# Patient Record
Sex: Male | Born: 1980 | Race: White | Hispanic: No | Marital: Single | Smoking: Former smoker
Health system: Southern US, Community
[De-identification: ages and names within clinical notes are randomized; demographics above are authoritative.]

## PROBLEM LIST (undated history)

## (undated) DIAGNOSIS — K589 Irritable bowel syndrome without diarrhea: Secondary | ICD-10-CM

## (undated) DIAGNOSIS — F32A Depression, unspecified: Secondary | ICD-10-CM

## (undated) DIAGNOSIS — F329 Major depressive disorder, single episode, unspecified: Secondary | ICD-10-CM

## (undated) DIAGNOSIS — F17200 Nicotine dependence, unspecified, uncomplicated: Secondary | ICD-10-CM

## (undated) DIAGNOSIS — G4700 Insomnia, unspecified: Secondary | ICD-10-CM

## (undated) HISTORY — DX: Irritable bowel syndrome, unspecified: K58.9

## (undated) HISTORY — DX: Insomnia, unspecified: G47.00

## (undated) HISTORY — DX: Major depressive disorder, single episode, unspecified: F32.9

## (undated) HISTORY — DX: Nicotine dependence, unspecified, uncomplicated: F17.200

## (undated) HISTORY — DX: Depression, unspecified: F32.A

## (undated) HISTORY — DX: Irritable bowel syndrome without diarrhea: K58.9

---

## 2011-07-16 ENCOUNTER — Ambulatory Visit (INDEPENDENT_AMBULATORY_CARE_PROVIDER_SITE_OTHER): Payer: BC Managed Care – PPO | Admitting: Family Medicine

## 2011-07-16 ENCOUNTER — Encounter: Payer: Self-pay | Admitting: Family Medicine

## 2011-07-16 VITALS — BP 120/70 | HR 84 | Ht 70.0 in | Wt 160.0 lb

## 2011-07-16 DIAGNOSIS — G4700 Insomnia, unspecified: Secondary | ICD-10-CM

## 2011-07-16 DIAGNOSIS — F3289 Other specified depressive episodes: Secondary | ICD-10-CM

## 2011-07-16 DIAGNOSIS — F17200 Nicotine dependence, unspecified, uncomplicated: Secondary | ICD-10-CM

## 2011-07-16 DIAGNOSIS — F329 Major depressive disorder, single episode, unspecified: Secondary | ICD-10-CM | POA: Insufficient documentation

## 2011-07-16 MED ORDER — CLONAZEPAM 1 MG PO TABS: 0.5000 mg | ORAL_TABLET | Freq: Every evening | ORAL | Status: DC | PRN

## 2011-07-16 MED ORDER — ESCITALOPRAM OXALATE 20 MG PO TABS: 20.0000 mg | ORAL_TABLET | Freq: Every day | ORAL | Status: DC

## 2011-07-16 NOTE — Patient Instructions (Signed)
Lexapro 20mg .  Start at 1/4 tablet for a few days, then increase to 1/2 tablet for 1-2 weeks, then up to full tablet daily.   Clonazepam for sleep--do not expect this to be used longterm.  Rash on toe--if you have used antifungal cream twice daily for over 2-3 weeks without improvement, and without any spread, likely not fungal.  Try a hydrocortisone cream twice daily for a week.

## 2011-07-16 NOTE — Progress Notes (Signed)
Chief Complaint  Patient presents with  . Headache    been having really bad headaches and trouble sleeping x 1 month. States that he was previously on Lexapro 20 mg would like to possibly restart this medication.   HPI:  He was on Lexapro for 3 years, stopped it about 2 years ago.  Things seemed to be okay until the last 6 months.  He is staying up late worrying about things, trouble falling asleep, feeling down.  Sometimes cries.  Loss of interest in things--no longer likes to play video games, football, or golf.  Trouble falling asleep, not staying asleep.  History of panic attacks, none recent. Lost weight--down 30 pounds in the last 2 years, unintentional.  Has had decreased appetite.  H/o depression since the age of 31 or 66.  Has been on Celexa, Wellbutrin, fluoxetine and Lexapro.  Lexapro seemed to work the best.  Nurse, learning disability and psychologist in the past, seemed to help at the time (about 10 years ago).  Not interested in counseling at this time mainly due to finances, so would like to try to restart meds first.  If he can afford it, and needs it, he would reconsider counseling.  He was off meds age [age], seems to be having recurrent depression every 6 years or so.  Fluoxetine caused terrible heartburn.  Celexa as okay, but didn't work as well as the [employer].  Can't recall what happened with the wellbutrin (was 10 years ago).   Insomnia--previously took seroquel, klonopin, trazadone, remeron and [place].  Didn't like Ambien at all, made him feel too groggy in the mornings.  Seroquel caused too much hunger. Klonopin seemed to work the best for him in the past.  Also complaining of possible athletes foot--using tinactin. Just on left foot, between 3rd and 4th toes. He has had this for quite a while, and doesn't seem to be responding to the topical antifungal.  It is very pruritic.  Past Medical History  Diagnosis Date  . Depression age 31    no SI or hospitalizations  . Smoker   .  Insomnia   . IBS (irritable bowel syndrome)     History reviewed. No pertinent past surgical history.  History   Social History  . Marital Status: Single    Spouse Name: N/A    Number of Children: 0  . Years of Education: N/A   Occupational History  . carpenter    Social History Main Topics  . Smoking status: Current Everyday Smoker -- 1.0 packs/day for 15 years  . Smokeless tobacco: Never Used  . Alcohol Use: Yes     very rare, once yearly  . Drug Use: Yes     marijuana usage maybe twice a year.  . Sexually Active: Not on file   Other Topics Concern  . Not on file   Social History Narrative   Kyle Castaneda; in between jobs currently.  Lives with a roommate, 1 cat    Family History  Problem Relation Age of Onset  . Asthma Mother   . Hypertension Mother   . Cancer Mother     melanoma  . Diabetes Father   . Cancer Paternal Aunt     breast cancer  . Stroke Maternal Grandmother   . Depression Maternal Grandfather   . Heart disease Paternal Grandfather    Medication: tylenol as needed.  Allergies  Allergen Reactions  . Aspirin Hives and Other (See Comments)    bloating  . Cephalosporins Hives and Other (  See Comments)    bloating  . Chantix (Varenicline) Other (See Comments)    nightmares   ROS:  Denies fevers, URI symptoms cough, shortness of breath, chest pain, palpitations, GI complaints, joint pains, skin/hair/bowel changes.  He has some fatigue, likely related to insomnia.  +rash on toe, +depression.  Denies suicidal ideation.  +anxiety  PHYSICAL EXAM: BP 120/70  Pulse 84  Ht 5\' 10"  (1.778 m)  Wt 160 lb (72.576 kg)  BMI 22.96 kg/m2 Well developed, pleasant male, in no distress HEENT:  Conjunctiva clear. PERRL, OP clear NECK: no lymphadenopathy, thyromegaly or mass Heart: regular rate and rhythm without murmur Lungs: clear bilaterally Abdomen: soft, nontender, no mass Extremities: no edema.  Redness and excoriation of distal 4th toe.  No  maceration Psych: mildly depressed mood, appears mildly anxious.  Normal eye contact, speech, hygiene and grooming, although smells of cigarette smoke  ASSESSMENT/PLAN: 1. Depressive disorder, not elsewhere classified  escitalopram (LEXAPRO) 20 MG tablet  2. Insomnia  clonazePAM (KLONOPIN) 1 MG tablet  3. Tobacco use disorder     Depression, recurrent--advised of the need for medication longterm Re-start Lexapro 20mg .  Start at 1/4 tablet for a few days, then increase to 1/2 tablet for 1-2 weeks, then up to full tablet daily.  He contracts for safety.  Insomnia--expect to improve once depression/anxiety improves with Lexapro.  In the short term, will use clonazepam to help sleep.    Rash on foot--since not responding to antifungal, and not spreading, likely isn't fungal.  Trial of steroid cream--OTC HC 1% BID x 7-10 days.  Tobacco abuse--discussed risks, and methods for cessation.  Also discussed free counseling available.  Will address further once depression improves.  Return in 4-6 weeks  Consider doing labs including TSH if not significantly improving (ie further weight loss)

## 2011-08-14 ENCOUNTER — Telehealth: Payer: Self-pay | Admitting: Internal Medicine

## 2011-08-14 DIAGNOSIS — G4700 Insomnia, unspecified: Secondary | ICD-10-CM

## 2011-08-14 MED ORDER — CLONAZEPAM 1 MG PO TABS: 0.5000 mg | ORAL_TABLET | Freq: Every evening | ORAL | Status: DC | PRN

## 2011-08-14 NOTE — Telephone Encounter (Signed)
Okay to refill #30, no refill.  Has f/u appt next week.

## 2011-08-14 NOTE — Telephone Encounter (Signed)
CALLED MED IN PER KNAPP 

## 2011-08-20 ENCOUNTER — Ambulatory Visit: Payer: BC Managed Care – PPO | Admitting: Family Medicine

## 2011-08-24 ENCOUNTER — Ambulatory Visit: Payer: BC Managed Care – PPO | Admitting: Family Medicine

## 2011-08-26 ENCOUNTER — Ambulatory Visit (INDEPENDENT_AMBULATORY_CARE_PROVIDER_SITE_OTHER): Payer: BC Managed Care – PPO | Admitting: Family Medicine

## 2011-08-26 ENCOUNTER — Encounter: Payer: Self-pay | Admitting: Family Medicine

## 2011-08-26 VITALS — BP 110/80 | HR 68 | Ht 70.0 in | Wt 158.0 lb

## 2011-08-26 DIAGNOSIS — F329 Major depressive disorder, single episode, unspecified: Secondary | ICD-10-CM

## 2011-08-26 DIAGNOSIS — G4700 Insomnia, unspecified: Secondary | ICD-10-CM

## 2011-08-26 DIAGNOSIS — F3289 Other specified depressive episodes: Secondary | ICD-10-CM

## 2011-08-26 DIAGNOSIS — F17200 Nicotine dependence, unspecified, uncomplicated: Secondary | ICD-10-CM

## 2011-08-26 MED ORDER — CLONAZEPAM 1 MG PO TABS: 1.0000 mg | ORAL_TABLET | Freq: Every evening | ORAL | Status: DC | PRN

## 2011-08-26 MED ORDER — ESCITALOPRAM OXALATE 20 MG PO TABS: 20.0000 mg | ORAL_TABLET | Freq: Every day | ORAL | Status: DC

## 2011-08-26 NOTE — Progress Notes (Signed)
Chief Complaint  Patient presents with  . Depression    5 week follow up-pt states that he is taking more than 0.5-1.0mg  of klonopin each evening, he is taking about 2 tabs each evening.   HPI: Patient presents to follow up on depression.  He took 10mg  of lexapro for a week and a half, then increased to approx 15mg , and has been on the full 20 mg now for about 3 weeks.  He feels like he is headed in the right direction--moods are better.  No crying.  He isn't having headaches anymore, so taking less tylenol, since sleeping better.  He is getting out more than he was, mostly looking for work.  Appetite is improved, eating more, but admits probably not as well as he should be (ie [employer]/fast food due to his finances).  The anxiety seems to hit him once he turns off TV, puts down his book, and lies down to fall asleep--can't shut mind off.  He has tried ocean sounds, leaving TV on, etc but didn't help sleep. He takes one klonopin around 8pm, goes to bed at 9pm, and takes another 1/2 at 10pm if it isn't working, and take the other half around midnight, only if needed.  Needs to take 2 tablets at least 3 times/week.   Cut down from 25-30 cigarettes/day to 8-10 cigarettes daily  Past Medical History  Diagnosis Date  . Depression age 31    no SI or hospitalizations  . Smoker   . Insomnia   . IBS (irritable bowel syndrome)    No past surgical history on file.  History   Social History  . Marital Status: Single    Spouse Name: N/A    Number of Children: 0  . Years of Education: N/A   Occupational History  . carpenter    Social History Main Topics  . Smoking status: Current Everyday Smoker -- 0.3 packs/day for 15 years  . Smokeless tobacco: Never Used  . Alcohol Use: Yes     very rare, once yearly  . Drug Use: Yes     marijuana usage maybe twice a year.  . Sexually Active: Not on file   Other Topics Concern  . Not on file   Social History Narrative   Kyle Castaneda; in between jobs  currently.  Lives with a roommate, 1 cat   Current Outpatient Prescriptions on File Prior to Visit  Medication Sig Dispense Refill  . acetaminophen (TYLENOL) 500 MG tablet Take 1,000-1,500 mg by mouth every 6 (six) hours as needed. Is currently taking close to every day, maybe every other day.      Marland Kitchen DISCONTD: escitalopram (LEXAPRO) 20 MG tablet Take 1 tablet (20 mg total) by mouth daily.  30 tablet  1  . DISCONTD: clonazePAM (KLONOPIN) 1 MG tablet Take 0.5-1 tablets (0.5-1 mg total) by mouth at bedtime as needed (insomnia or anxiety).  30 tablet  0   Allergies  Allergen Reactions  . Aspirin Hives and Other (See Comments)    bloating  . Cephalosporins Hives and Other (See Comments)    bloating  . Chantix (Varenicline) Other (See Comments)    nightmares   ROS:  Denies suicidal ideation.  +insomnia.  Depression improved.  Appetite improved.  No significant weight loss.  Denies headaches, GI complaints, skin rashes or other concerns  PHYSICAL EXAM: BP 110/80  Pulse 68  Ht 5\' 10"  (1.778 m)  Wt 158 lb (71.668 kg)  BMI 22.67 kg/m2 Well developed, pleasant male,  smelling of tobacco smoke Psych: normal mood, affect, hygiene, grooming.  Normal eye contact, speech  ASSESSMENT/PLAN: 1. Insomnia    2. Depressive disorder, not elsewhere classified  escitalopram (LEXAPRO) 20 MG tablet  3. Tobacco use disorder     Depression--improved.  May expect some continued improvement over the next couple of weeks. Insomnia/anxiety--discussed risks (dependence, tolerance) of benzo's.  Continue klonopin--may use up to 2mg  nightly, if needed (#60 with 2 refills rx printed)  Smoking--continue to decrease.  Not ready to quit yet, but when ready, will return to discuss Chantix (took in past and tolerate, bad dreams, but tolerable--willing to retry)  F/u in 6 months, sooner prn if not doing well on this regimen  25 minute visit, all counseling

## 2011-11-04 ENCOUNTER — Telehealth: Payer: Self-pay | Admitting: Family Medicine
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
## 2011-11-09 ENCOUNTER — Telehealth: Payer: Self-pay | Admitting: Family Medicine

## 2011-11-09 DIAGNOSIS — F419 Anxiety disorder, unspecified: Secondary | ICD-10-CM

## 2011-11-09 MED ORDER — CLONAZEPAM 1 MG PO TABS: 1.0000 mg | ORAL_TABLET | Freq: Every evening | ORAL | Status: DC | PRN
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
## 2012-01-21 ENCOUNTER — Encounter: Payer: Self-pay | Admitting: Internal Medicine

## 2012-02-22 ENCOUNTER — Ambulatory Visit: Payer: BC Managed Care – PPO | Admitting: Family Medicine

## 2014-01-15 ENCOUNTER — Encounter (HOSPITAL_COMMUNITY): Payer: Self-pay | Admitting: Emergency Medicine

## 2014-01-15 ENCOUNTER — Emergency Department (HOSPITAL_COMMUNITY): Payer: BC Managed Care – PPO

## 2014-01-15 ENCOUNTER — Emergency Department (HOSPITAL_COMMUNITY)
Admission: EM | Admit: 2014-01-15 | Discharge: 2014-01-15 | Disposition: A | Discharge status: Home / Self Care | Payer: BC Managed Care – PPO | Attending: Emergency Medicine | Admitting: Emergency Medicine

## 2014-01-15 DIAGNOSIS — Y92410 Unspecified street and highway as the place of occurrence of the external cause: Secondary | ICD-10-CM | POA: Insufficient documentation

## 2014-01-15 DIAGNOSIS — Y9355 Activity, bike riding: Secondary | ICD-10-CM | POA: Insufficient documentation

## 2014-01-15 DIAGNOSIS — Z720 Tobacco use: Secondary | ICD-10-CM | POA: Insufficient documentation

## 2014-01-15 DIAGNOSIS — G4700 Insomnia, unspecified: Secondary | ICD-10-CM | POA: Insufficient documentation

## 2014-01-15 DIAGNOSIS — K589 Irritable bowel syndrome without diarrhea: Secondary | ICD-10-CM | POA: Insufficient documentation

## 2014-01-15 DIAGNOSIS — Y998 Other external cause status: Secondary | ICD-10-CM | POA: Insufficient documentation

## 2014-01-15 DIAGNOSIS — M533 Sacrococcygeal disorders, not elsewhere classified: Secondary | ICD-10-CM

## 2014-01-15 DIAGNOSIS — S3992XA Unspecified injury of lower back, initial encounter: Secondary | ICD-10-CM | POA: Insufficient documentation

## 2014-01-15 DIAGNOSIS — F329 Major depressive disorder, single episode, unspecified: Secondary | ICD-10-CM | POA: Insufficient documentation

## 2014-01-15 MED ORDER — IBUPROFEN 800 MG PO TABS: 800.0000 mg | ORAL_TABLET | Freq: Three times a day (TID) | ORAL | Status: DC
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
## 2014-01-16 ENCOUNTER — Emergency Department (HOSPITAL_COMMUNITY)
Admission: EM | Admit: 2014-01-16 | Discharge: 2014-01-17 | Disposition: A | Discharge status: Home / Self Care | Payer: BC Managed Care – PPO | Attending: Emergency Medicine | Admitting: Emergency Medicine

## 2014-01-16 ENCOUNTER — Encounter (HOSPITAL_COMMUNITY): Payer: Self-pay | Admitting: Emergency Medicine

## 2014-01-16 DIAGNOSIS — Y998 Other external cause status: Secondary | ICD-10-CM | POA: Insufficient documentation

## 2014-01-16 DIAGNOSIS — Y9389 Activity, other specified: Secondary | ICD-10-CM | POA: Insufficient documentation

## 2014-01-16 DIAGNOSIS — Z720 Tobacco use: Secondary | ICD-10-CM | POA: Insufficient documentation

## 2014-01-16 DIAGNOSIS — F329 Major depressive disorder, single episode, unspecified: Secondary | ICD-10-CM | POA: Insufficient documentation

## 2014-01-16 DIAGNOSIS — M545 Low back pain, unspecified: Secondary | ICD-10-CM

## 2014-01-16 DIAGNOSIS — Y92410 Unspecified street and highway as the place of occurrence of the external cause: Secondary | ICD-10-CM | POA: Insufficient documentation

## 2014-01-16 DIAGNOSIS — Z791 Long term (current) use of non-steroidal anti-inflammatories (NSAID): Secondary | ICD-10-CM | POA: Insufficient documentation

## 2014-01-16 DIAGNOSIS — Z79899 Other long term (current) drug therapy: Secondary | ICD-10-CM | POA: Insufficient documentation

## 2014-01-16 DIAGNOSIS — Z8719 Personal history of other diseases of the digestive system: Secondary | ICD-10-CM | POA: Insufficient documentation

## 2014-01-16 DIAGNOSIS — S3992XA Unspecified injury of lower back, initial encounter: Secondary | ICD-10-CM | POA: Insufficient documentation

## 2014-01-16 DIAGNOSIS — G4700 Insomnia, unspecified: Secondary | ICD-10-CM | POA: Insufficient documentation
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
## 2014-01-17 MED ORDER — HYDROCODONE-ACETAMINOPHEN 5-325 MG PO TABS: 1.0000 | ORAL_TABLET | ORAL | Status: DC | PRN

## 2014-01-17 NOTE — Discharge Instructions (Signed)
Cryotherapy °Cryotherapy means treatment with cold. Ice or gel packs can be used to reduce both pain and swelling. Ice is the most helpful within the first 24 to 48 hours after an injury or flare-up from overusing a muscle or joint. Sprains, strains, spasms, burning pain, shooting pain, and aches can all be eased with ice. Ice can also be used when recovering from surgery. Ice is effective, has very few side effects, and is safe for most people to use. °PRECAUTIONS  °Ice is not a safe treatment option for people with: °· Raynaud phenomenon. This is a condition affecting small blood vessels in the extremities. Exposure to cold may cause your problems to return. °· Cold hypersensitivity. There are many forms of cold hypersensitivity, including: °¨ Cold urticaria. Red, itchy hives appear on the skin when the tissues begin to warm after being iced. °¨ Cold erythema. This is a red, itchy rash caused by exposure to cold. °¨ Cold hemoglobinuria. Red blood cells break down when the tissues begin to warm after being iced. The hemoglobin that carry oxygen are passed into the urine because they cannot combine with blood proteins fast enough. °· Numbness or altered sensitivity in the area being iced. °If you have any of the following conditions, do not use ice until you have discussed cryotherapy with your caregiver: °· Heart conditions, such as arrhythmia, angina, or chronic heart disease. °· High blood pressure. °· Healing wounds or open skin in the area being iced. °· Current infections. °· Rheumatoid arthritis. °· Poor circulation. °· Diabetes. °Ice slows the blood flow in the region it is applied. This is beneficial when trying to stop inflamed tissues from spreading irritating chemicals to surrounding tissues. However, if you expose your skin to cold temperatures for too long or without the proper protection, you can damage your skin or nerves. Watch for signs of skin damage due to cold. °HOME CARE INSTRUCTIONS °Follow  these tips to use ice and cold packs safely. °· Place a dry or damp towel between the ice and skin. A damp towel will cool the skin more quickly, so you may need to shorten the time that the ice is used. °· For a more rapid response, add gentle compression to the ice. °· Ice for no more than 10 to 20 minutes at a time. The bonier the area you are icing, the less time it will take to get the benefits of ice. °· Check your skin after 5 minutes to make sure there are no signs of a poor response to cold or skin damage. °· Rest 20 minutes or more between uses. °· Once your skin is numb, you can end your treatment. You can test numbness by very lightly touching your skin. The touch should be so light that you do not see the skin dimple from the pressure of your fingertip. When using ice, most people will feel these normal sensations in this order: cold, burning, aching, and numbness. °· Do not use ice on someone who cannot communicate their responses to pain, such as small children or people with dementia. °HOW TO MAKE AN ICE PACK °Ice packs are the most common way to use ice therapy. Other methods include ice massage, ice baths, and cryosprays. Muscle creams that cause a cold, tingly feeling do not offer the same benefits that ice offers and should not be used as a substitute unless recommended by your caregiver. °To make an ice pack, do one of the following: °· Place crushed ice or a   bag of frozen vegetables in a sealable plastic bag. Squeeze out the excess air. Place this bag inside another plastic bag. Slide the bag into a pillowcase or place a damp towel between your skin and the bag. °· Mix 3 parts water with 1 part rubbing alcohol. Freeze the mixture in a sealable plastic bag. When you remove the mixture from the freezer, it will be slushy. Squeeze out the excess air. Place this bag inside another plastic bag. Slide the bag into a pillowcase or place a damp towel between your skin and the bag. °SEEK MEDICAL CARE  IF: °· You develop white spots on your skin. This may give the skin a blotchy (mottled) appearance. °· Your skin turns blue or pale. °· Your skin becomes waxy or hard. °· Your swelling gets worse. °MAKE SURE YOU:  °· Understand these instructions. °· Will watch your condition. °· Will get help right away if you are not doing well or get worse. °Document Released: 09/15/2010 Document Revised: 06/05/2013 Document Reviewed: 09/15/2010 °[employer]® Patient Information ©2015 [employer]. This information is not intended to replace advice given to you by your health care provider. Make sure you discuss any questions you have with your health care provider. °Heat Therapy °Heat therapy can help ease sore, stiff, injured, and tight muscles and joints. Heat relaxes your muscles, which may help ease your pain.  °RISKS AND COMPLICATIONS °If you have any of the following conditions, do not use heat therapy unless your health care provider has approved: °· Poor circulation. °· Healing wounds or scarred skin in the area being treated. °· Diabetes, heart disease, or high blood pressure. °· Not being able to feel (numbness) the area being treated. °· Unusual swelling of the area being treated. °· Active infections. °· Blood clots. °· Cancer. °· Inability to communicate pain. This may include young children and people who have problems with their brain function (dementia). °· Pregnancy. °Heat therapy should only be used on old, pre-existing, or long-lasting (chronic) injuries. Do not use heat therapy on new injuries unless directed by your health care provider. °HOW TO USE HEAT THERAPY °There are several different kinds of heat therapy, including: °· Moist heat pack. °· Warm water bath. °· Hot water bottle. °· Electric heating pad. °· Heated gel pack. °· Heated wrap. °· Electric heating pad. °Use the heat therapy method suggested by your health care provider. Follow your health care provider's instructions on when and how to use heat  therapy. °GENERAL HEAT THERAPY RECOMMENDATIONS °· Do not sleep while using heat therapy. Only use heat therapy while you are awake. °· Your skin may turn pink while using heat therapy. Do not use heat therapy if your skin turns red. °· Do not use heat therapy if you have new pain. °· High heat or long exposure to heat can cause burns. Be careful when using heat therapy to avoid burning your skin. °· Do not use heat therapy on areas of your skin that are already irritated, such as with a rash or sunburn. °SEEK MEDICAL CARE IF: °· You have blisters, redness, swelling, or numbness. °· You have new pain. °· Your pain is worse. °MAKE SURE YOU: °· Understand these instructions. °· Will watch your condition. °· Will get help right away if you are not doing well or get worse. °Document Released: 04/13/2011 Document Revised: 06/05/2013 Document Reviewed: 03/14/2013 °[employer]® Patient Information ©2015 [employer]. This information is not intended to replace advice given to you by your health care provider.   Make sure you discuss any questions you have with your health care provider. ° °
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
## 2014-02-28 ENCOUNTER — Encounter: Payer: Self-pay | Admitting: Family Medicine

## 2014-02-28 ENCOUNTER — Ambulatory Visit (INDEPENDENT_AMBULATORY_CARE_PROVIDER_SITE_OTHER): Payer: Self-pay | Admitting: Family Medicine

## 2014-02-28 VITALS — BP 112/70 | HR 60 | Ht 69.0 in | Wt 162.0 lb

## 2014-02-28 DIAGNOSIS — M545 Low back pain, unspecified: Secondary | ICD-10-CM

## 2014-02-28 DIAGNOSIS — G4700 Insomnia, unspecified: Secondary | ICD-10-CM

## 2014-02-28 DIAGNOSIS — F419 Anxiety disorder, unspecified: Secondary | ICD-10-CM | POA: Insufficient documentation

## 2014-02-28 DIAGNOSIS — F339 Major depressive disorder, recurrent, unspecified: Secondary | ICD-10-CM

## 2014-02-28 MED ORDER — CLONAZEPAM 1 MG PO TABS: 1.0000 mg | ORAL_TABLET | Freq: Every evening | ORAL | Status: DC | PRN

## 2014-02-28 MED ORDER — ESCITALOPRAM OXALATE 20 MG PO TABS: 20.0000 mg | ORAL_TABLET | Freq: Every day | ORAL | Status: AC

## 2014-02-28 NOTE — Patient Instructions (Signed)
  Restart lexapro at 1/2 tablet once daily.  Increase to full tablet in a week. Return in 6 weeks if not improving.   Use the clonazepam at bedtime only if needed for anxiety/sleep.  I suspect you will need this much less once the lexapro becomes effective.  This is not intended for long-term chronic use, like the lexapro is.  Try and work on breaking the hand-mouth habit (of vaping) to help with longterm smoking cessation as we discussed.  You have some pain at your SI joint, and some muscle spasm. Heat, massage, stretches, and working on core strengthening and flexibility should help prevent back problems.  Sometimes chiropractor can be helpful (especially for SI joint pain).
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
## 2014-03-29 ENCOUNTER — Other Ambulatory Visit: Payer: Self-pay | Admitting: Family Medicine

## 2014-03-29 NOTE — Telephone Encounter (Signed)
Refill request for cloneazepam to [facility] [address]

## 2014-03-29 NOTE — Telephone Encounter (Signed)
Tammy spoke with patient when he called and told him Dr.Knapp's response. He said he is still needing the clonazepam but doesn't have insurance yet. He will call back to schedule appt's once he gets insurance, which should be soon. He was told he would not get another rx after this one unless OV is scheduled-patient verbalized understanding. [facility].

## 2014-03-29 NOTE — Telephone Encounter (Signed)
This is only intended for short-term use (until moods improved with lexapro, and then sleep should also improve).  This is last prescription without OV to discuss.  He didn't need to schedule 6wk f/u if doing well on Lexapro (also meaning not needing nightly clonazepam).  If ongoing need for rx, needs OV.  He also should schedule CPE

## 2014-03-29 NOTE — Telephone Encounter (Signed)
Is this okay to refill? 

## 2014-04-11 ENCOUNTER — Institutional Professional Consult (permissible substitution): Payer: Self-pay | Admitting: Family Medicine

## 2014-04-23 ENCOUNTER — Institutional Professional Consult (permissible substitution): Payer: Self-pay | Admitting: Family Medicine

## 2014-04-30 ENCOUNTER — Telehealth: Payer: Self-pay | Admitting: Internal Medicine

## 2014-04-30 ENCOUNTER — Ambulatory Visit: Payer: Self-pay | Admitting: Family Medicine

## 2014-04-30 NOTE — Telephone Encounter (Signed)
No follow-up necessary.  He has no-showed twice in the last month. He needs to be discharged

## 2014-04-30 NOTE — Telephone Encounter (Signed)
This patient no showed for their appointment today.Which of the following is necessary for this patient.   A) No follow-up necessary   B) Follow-up urgent. Locate Patient Immediately.   C) Follow-up necessary. Contact patient and Schedule visit in ____ Days.   D) Follow-up Advised. Contact patient and Schedule visit in ____ Days. 

## 2014-04-30 NOTE — Telephone Encounter (Signed)
I am sending this to you for dismissal

## 2014-05-14 ENCOUNTER — Encounter: Payer: Self-pay | Admitting: Family Medicine

## 2014-06-01 ENCOUNTER — Encounter (HOSPITAL_COMMUNITY): Payer: Self-pay | Admitting: Emergency Medicine

## 2014-06-01 ENCOUNTER — Emergency Department (HOSPITAL_COMMUNITY)
Admission: EM | Admit: 2014-06-01 | Discharge: 2014-06-01 | Disposition: A | Discharge status: Home / Self Care | Payer: Self-pay | Attending: Emergency Medicine | Admitting: Emergency Medicine

## 2014-06-01 ENCOUNTER — Emergency Department (HOSPITAL_COMMUNITY): Payer: Self-pay

## 2014-06-01 DIAGNOSIS — Z8719 Personal history of other diseases of the digestive system: Secondary | ICD-10-CM | POA: Insufficient documentation

## 2014-06-01 DIAGNOSIS — M545 Low back pain, unspecified: Secondary | ICD-10-CM

## 2014-06-01 DIAGNOSIS — Y998 Other external cause status: Secondary | ICD-10-CM | POA: Insufficient documentation

## 2014-06-01 DIAGNOSIS — Y92481 Parking lot as the place of occurrence of the external cause: Secondary | ICD-10-CM | POA: Insufficient documentation

## 2014-06-01 DIAGNOSIS — S3992XA Unspecified injury of lower back, initial encounter: Secondary | ICD-10-CM | POA: Insufficient documentation

## 2014-06-01 DIAGNOSIS — Z87891 Personal history of nicotine dependence: Secondary | ICD-10-CM | POA: Insufficient documentation

## 2014-06-01 DIAGNOSIS — F419 Anxiety disorder, unspecified: Secondary | ICD-10-CM | POA: Insufficient documentation

## 2014-06-01 DIAGNOSIS — M542 Cervicalgia: Secondary | ICD-10-CM

## 2014-06-01 DIAGNOSIS — Y9389 Activity, other specified: Secondary | ICD-10-CM | POA: Insufficient documentation

## 2014-06-01 DIAGNOSIS — Z8669 Personal history of other diseases of the nervous system and sense organs: Secondary | ICD-10-CM | POA: Insufficient documentation

## 2014-06-01 DIAGNOSIS — S199XXA Unspecified injury of neck, initial encounter: Secondary | ICD-10-CM | POA: Insufficient documentation

## 2014-06-01 MED ORDER — DIAZEPAM 5 MG PO TABS
5.0000 mg | ORAL_TABLET | Freq: Once | ORAL | Status: AC
  Administered 2014-06-01: 5 mg via ORAL
  Filled 2014-06-01: qty 1

## 2014-06-01 MED ORDER — CYCLOBENZAPRINE HCL 10 MG PO TABS: 10.0000 mg | ORAL_TABLET | Freq: Three times a day (TID) | ORAL | Status: AC | PRN
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
